# Patient Record
Sex: Female | Born: 1980 | Race: White | Hispanic: No | Marital: Married | Smoking: Never smoker
Health system: Southern US, Community
[De-identification: ages and names within clinical notes are randomized; demographics above are authoritative.]

## PROBLEM LIST (undated history)

## (undated) DIAGNOSIS — N912 Amenorrhea, unspecified: Secondary | ICD-10-CM

## (undated) DIAGNOSIS — K429 Umbilical hernia without obstruction or gangrene: Secondary | ICD-10-CM

---

## 2009-02-09 ENCOUNTER — Ambulatory Visit (HOSPITAL_COMMUNITY): Admission: RE | Admit: 2009-02-09 | Discharge: 2009-02-09 | Payer: Self-pay | Admitting: Gynecology

## 2009-11-05 ENCOUNTER — Inpatient Hospital Stay (HOSPITAL_COMMUNITY): Admission: AD | Admit: 2009-11-05 | Discharge: 2009-11-07 | Payer: Self-pay | Admitting: Obstetrics & Gynecology

## 2010-09-13 LAB — CBC
MCHC: 35.2 g/dL (ref 30.0–36.0)
MCV: 93.8 fL (ref 78.0–100.0)
Platelets: 151 10*3/uL (ref 150–400)
Platelets: 164 10*3/uL (ref 150–400)
RDW: 12.2 % (ref 11.5–15.5)
WBC: 14.7 10*3/uL — ABNORMAL HIGH (ref 4.0–10.5)

## 2010-09-13 LAB — RPR: RPR Ser Ql: NONREACTIVE

## 2011-01-17 ENCOUNTER — Other Ambulatory Visit (HOSPITAL_COMMUNITY): Payer: Self-pay | Admitting: Gynecology

## 2011-01-17 DIAGNOSIS — N979 Female infertility, unspecified: Secondary | ICD-10-CM

## 2011-01-24 ENCOUNTER — Ambulatory Visit (HOSPITAL_COMMUNITY)
Admission: RE | Admit: 2011-01-24 | Discharge: 2011-01-24 | Disposition: A | Discharge status: Home / Self Care | Payer: BC Managed Care – PPO | Source: Ambulatory Visit | Attending: Gynecology | Admitting: Gynecology

## 2011-01-24 DIAGNOSIS — N979 Female infertility, unspecified: Secondary | ICD-10-CM | POA: Insufficient documentation

## 2011-01-24 MED ORDER — IOHEXOL 300 MG/ML  SOLN: 10.0000 mL | Freq: Once | INTRAMUSCULAR | Status: AC | PRN

## 2011-06-27 NOTE — L&D Delivery Note (Signed)
Delivery Note At 6:26 AM a viable and healthy female was delivered via Vaginal, Spontaneous Delivery (Presentation: Right Occiput Anterior).  APGAR: 9, 9; weight Pending.   Placenta status: Intact, Spontaneous.  Cord: 3 vessels   Anesthesia: None, 15cc 1% lidocaine Episiotomy: None Lacerations: 2nd degree;Perineal Suture Repair: 3.0 vicryl Est. Blood Loss (mL): 250 cc  Mom to postpartum.  Baby to nursery-stable.  Marcell Chavarin H. 06/06/2012, 6:50 AM

## 2011-11-29 LAB — OB RESULTS CONSOLE GC/CHLAMYDIA: Chlamydia: NEGATIVE

## 2011-11-29 LAB — OB RESULTS CONSOLE RPR: RPR: NONREACTIVE

## 2011-11-29 LAB — OB RESULTS CONSOLE HIV ANTIBODY (ROUTINE TESTING): HIV: NONREACTIVE

## 2011-11-29 LAB — OB RESULTS CONSOLE GBS: GBS: POSITIVE

## 2012-05-04 IMAGING — RF DG HYSTEROGRAM
8 series · 8 of 8 positions shown · non-contrast
Comparison: 02/09/2009

HYSTEROSALPINGOGRAM

CLINICAL DATA: Infertility
TECHNIQUE: Hysterosalpingogram was performed by the ordering
physician under fluoroscopy.  Fluoroscopic images are submitted for
interpretation following the procedure.

Fluoroscopy Time:  1.6 minutes.

[Series 1: run · 1 of 1 slices shown (1 of 8)]
[im 1/1]
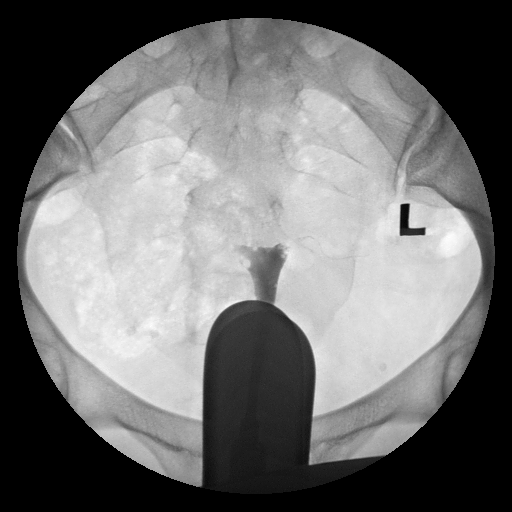

[Series 2: run · 1 of 1 slices shown (2 of 8)]
[im 1/1]
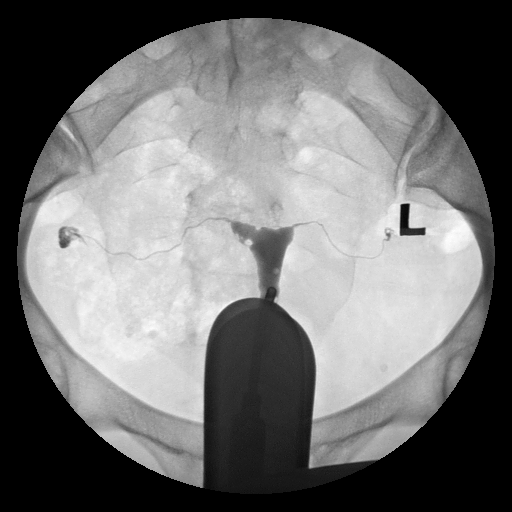

[Series 3: run · 1 of 1 slices shown (3 of 8)]
[im 1/1]
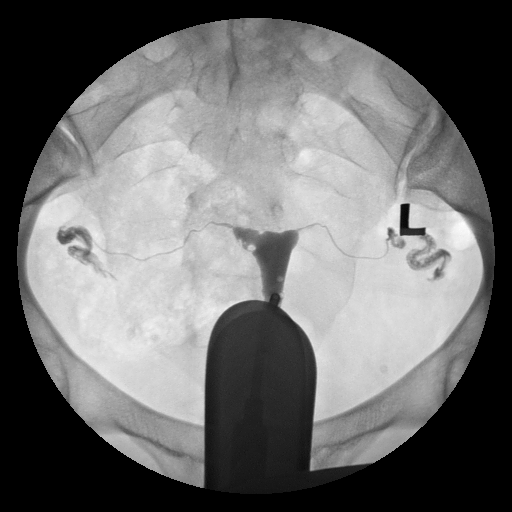

[Series 4: run · 1 of 1 slices shown (4 of 8)]
[im 1/1]
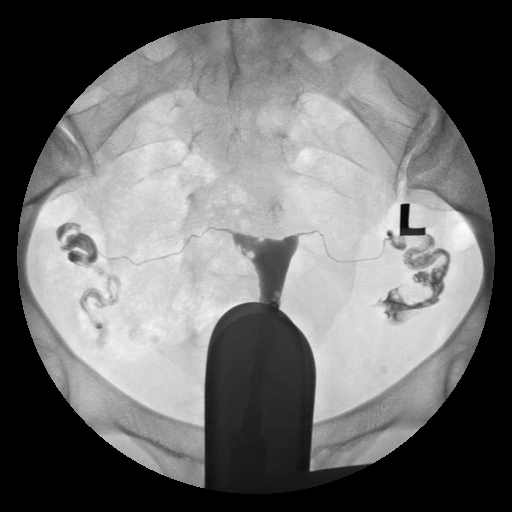

[Series 5: run · 1 of 1 slices shown (5 of 8)]
[im 1/1]
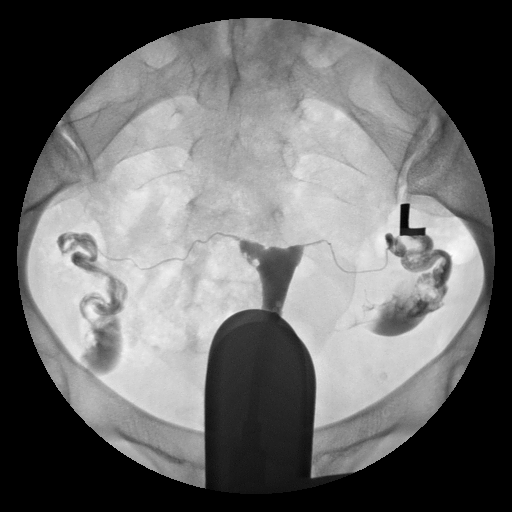

[Series 6: run · 1 of 1 slices shown (6 of 8)]
[im 1/1]
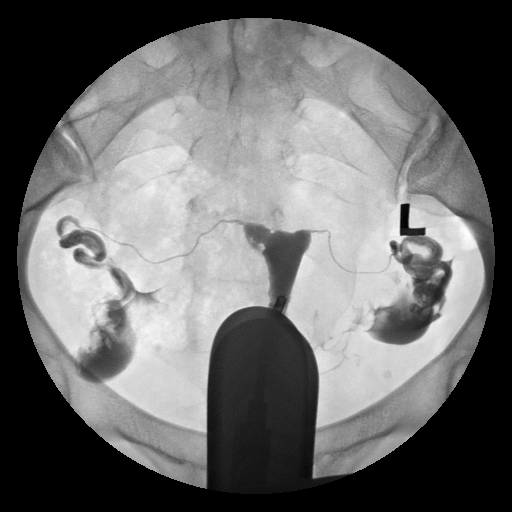

[Series 7: run · 1 of 1 slices shown (7 of 8)]
[im 1/1]
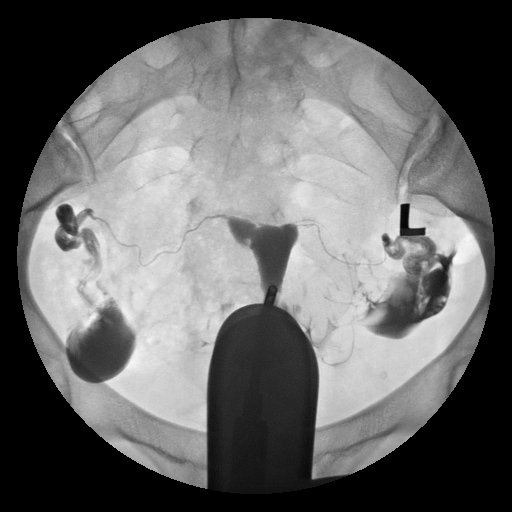

[Series 8: run · 1 of 1 slices shown (8 of 8)]
[im 1/1]
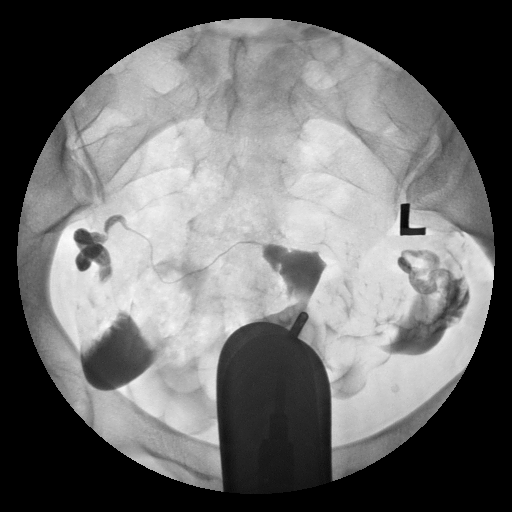

[8 of 8 positions shown; findings below may reference images not displayed]

FINDINGS: The endometrial cavity of the uterus is normal in
contour, although there are multiple rounded oval filling defects,
some which are mobile, others which are not.

Contrast filling of both fallopian tubes is seen, and both tubes
are normal in appearance.  Intraperitoneal spill of the contrast
from both fallopian tubes is demonstrated.  An image of the cervix
with cannula and speculum removed was not obtained.
IMPRESSION: Bilateral tubal patency.

Filling defects within the endometrial canal could represent
polyps, submucosal fibroids, or air bubbles.

## 2012-06-05 ENCOUNTER — Encounter (HOSPITAL_COMMUNITY): Payer: Self-pay | Admitting: *Deleted

## 2012-06-05 ENCOUNTER — Inpatient Hospital Stay (HOSPITAL_COMMUNITY)
Admission: AD | Admit: 2012-06-05 | Discharge: 2012-06-07 | DRG: 373 | Disposition: A | Discharge status: Home / Self Care | Payer: BC Managed Care – PPO | Source: Ambulatory Visit | Attending: Obstetrics and Gynecology | Admitting: Obstetrics and Gynecology

## 2012-06-05 DIAGNOSIS — O99892 Other specified diseases and conditions complicating childbirth: Secondary | ICD-10-CM | POA: Diagnosis present

## 2012-06-05 DIAGNOSIS — Z22330 Carrier of Group B streptococcus: Secondary | ICD-10-CM

## 2012-06-05 LAB — CBC
HCT: 33.3 % — ABNORMAL LOW (ref 36.0–46.0)
Hemoglobin: 11.3 g/dL — ABNORMAL LOW (ref 12.0–15.0)
RBC: 3.73 MIL/uL — ABNORMAL LOW (ref 3.87–5.11)
RDW: 12.4 % (ref 11.5–15.5)
WBC: 12 10*3/uL — ABNORMAL HIGH (ref 4.0–10.5)

## 2012-06-05 MED ORDER — OXYTOCIN BOLUS FROM INFUSION: 500.0000 mL | INTRAVENOUS | Status: DC

## 2012-06-05 MED ORDER — BUTORPHANOL TARTRATE 1 MG/ML IJ SOLN: 1.0000 mg | INTRAMUSCULAR | Status: DC | PRN

## 2012-06-05 MED ORDER — OXYTOCIN 40 UNITS IN LACTATED RINGERS INFUSION - SIMPLE MED
62.5000 mL/h | INTRAVENOUS | Status: DC
  Filled 2012-06-05: qty 1000

## 2012-06-05 MED ORDER — LACTATED RINGERS IV SOLN: 500.0000 mL | INTRAVENOUS | Status: DC | PRN

## 2012-06-05 MED ORDER — PENICILLIN G POTASSIUM 5000000 UNITS IJ SOLR
5.0000 10*6.[IU] | Freq: Once | INTRAVENOUS | Status: AC
  Administered 2012-06-06: 5 10*6.[IU] via INTRAVENOUS
  Filled 2012-06-05: qty 5

## 2012-06-05 MED ORDER — ONDANSETRON HCL 4 MG/2ML IJ SOLN: 4.0000 mg | Freq: Four times a day (QID) | INTRAMUSCULAR | Status: DC | PRN

## 2012-06-05 MED ORDER — LACTATED RINGERS IV SOLN: INTRAVENOUS | Status: DC

## 2012-06-05 MED ORDER — ACETAMINOPHEN 325 MG PO TABS: 650.0000 mg | ORAL_TABLET | ORAL | Status: DC | PRN

## 2012-06-05 MED ORDER — IBUPROFEN 600 MG PO TABS
600.0000 mg | ORAL_TABLET | Freq: Four times a day (QID) | ORAL | Status: DC | PRN
  Administered 2012-06-06: 600 mg via ORAL
  Filled 2012-06-05: qty 1

## 2012-06-05 MED ORDER — LIDOCAINE HCL (PF) 1 % IJ SOLN
30.0000 mL | INTRAMUSCULAR | Status: DC | PRN
  Administered 2012-06-06: 30 mL via SUBCUTANEOUS
  Filled 2012-06-05: qty 30

## 2012-06-05 MED ORDER — OXYCODONE-ACETAMINOPHEN 5-325 MG PO TABS
1.0000 | ORAL_TABLET | ORAL | Status: DC | PRN
Start: 2012-06-05 — End: 2012-06-06

## 2012-06-05 MED ORDER — PENICILLIN G POTASSIUM 5000000 UNITS IJ SOLR
2.5000 10*6.[IU] | INTRAVENOUS | Status: DC
  Administered 2012-06-06: 2.5 10*6.[IU] via INTRAVENOUS
  Filled 2012-06-05 (×4): qty 2.5

## 2012-06-05 MED ORDER — CITRIC ACID-SODIUM CITRATE 334-500 MG/5ML PO SOLN: 30.0000 mL | ORAL | Status: DC | PRN

## 2012-06-05 NOTE — MAU Note (Signed)
Pt G3 P1 at 38.4wks contracting every .  Seen in the office today SVE 4/90%.

## 2012-06-05 NOTE — MAU Note (Signed)
Pt states she has been having contractions since about 1800. Pt states she was examined by Dr Tenny Craw. 4cm

## 2012-06-05 NOTE — H&P (Signed)
Felicia Nichols is a 31 y.o. female presenting for contractions.  31 yo G2P0101 @ 38+4 presents c/o contractions. The patient was seen earlier today in the office and her cervix was 4/80/-2.  Since 6 pm she has noted more painful contractions and some bloody show.  She presents now with painful contractions every 5 minutes.  Her pregnancy has been uncomplicated up to this point except for GBS + urine culture in the beginning of the pregnancy. Her cervix is now 4-5/c/0 station and she'll be admitted for labor. History OB History    Grav Para Term Preterm Abortions TAB SAB Ect Mult Living   2 1 0 1 0 0 0 0 0 1      Past Medical History  Diagnosis Date  . No pertinent past medical history    Past Surgical History  Procedure Date  . No past surgeries    Family History: family history is negative for Alcohol abuse, and Arthritis, and Asthma, and Birth defects, and Cancer, and COPD, and Depression, and Diabetes, and Drug abuse, and Early death, and Hearing loss, and Heart disease, and Hyperlipidemia, and Hypertension, and Kidney disease, and Learning disabilities, and Mental illness, and Mental retardation, and Miscarriages / Stillbirths, and Stroke, and Vision loss, . Social History:  reports that she has never smoked. She does not have any smokeless tobacco history on file. She reports that she does not drink alcohol or use illicit drugs.   Prenatal Transfer Tool  Maternal Diabetes: No. Elevated 1hr gtt, 3hr WNL Genetic Screening: Normal Maternal Ultrasounds/Referrals: Normal Fetal Ultrasounds or other Referrals:  None Maternal Substance Abuse:  No Significant Maternal Medications:  None Significant Maternal Lab Results:  None Other Comments:  None  ROS: as above    Blood pressure 120/81, pulse 69, temperature 97.9 F (36.6 C), temperature source Oral, resp. rate 18, height 5\' 8"  (1.727 m), weight 80.105 kg (176 lb 9.6 oz), last menstrual period 01/15/2011. Exam Physical Exam  AOx3,  NAD Gravid, soft, NT Cvx 4-5/c/0 toco q3-5 FHT 140 reactive  Prenatal labs: ABO, Rh:  A pos Antibody:  Neg Rubella:  Imm RPR:   NR HBsAg:   Neg HIV:   NR GBS:   Positive  Assessment/Plan: 1) Admit 2) PCN for GBS 3) Expectant management 4) Epidural on request   Murrell Dome H. 06/05/2012, 10:08 PM

## 2012-06-06 ENCOUNTER — Encounter (HOSPITAL_COMMUNITY): Payer: Self-pay | Admitting: *Deleted

## 2012-06-06 LAB — ABO/RH: ABO/RH(D): A POS

## 2012-06-06 LAB — TYPE AND SCREEN

## 2012-06-06 MED ORDER — PRENATAL MULTIVITAMIN CH
1.0000 | ORAL_TABLET | Freq: Every day | ORAL | Status: DC
  Administered 2012-06-06 – 2012-06-07 (×2): 1 via ORAL
  Filled 2012-06-06 (×2): qty 1

## 2012-06-06 MED ORDER — DIPHENHYDRAMINE HCL 50 MG/ML IJ SOLN: 12.5000 mg | INTRAMUSCULAR | Status: DC | PRN

## 2012-06-06 MED ORDER — ONDANSETRON HCL 4 MG/2ML IJ SOLN: 4.0000 mg | INTRAMUSCULAR | Status: DC | PRN

## 2012-06-06 MED ORDER — TERBUTALINE SULFATE 1 MG/ML IJ SOLN: 0.2500 mg | Freq: Once | INTRAMUSCULAR | Status: DC | PRN

## 2012-06-06 MED ORDER — OXYCODONE-ACETAMINOPHEN 5-325 MG PO TABS
1.0000 | ORAL_TABLET | ORAL | Status: DC | PRN
  Administered 2012-06-06 (×2): 1 via ORAL
  Filled 2012-06-06 (×3): qty 1

## 2012-06-06 MED ORDER — IBUPROFEN 600 MG PO TABS
600.0000 mg | ORAL_TABLET | Freq: Four times a day (QID) | ORAL | Status: DC
  Administered 2012-06-06 – 2012-06-07 (×5): 600 mg via ORAL
  Filled 2012-06-06 (×5): qty 1

## 2012-06-06 MED ORDER — FENTANYL 2.5 MCG/ML BUPIVACAINE 1/10 % EPIDURAL INFUSION (WH - ANES): 14.0000 mL/h | INTRAMUSCULAR | Status: DC

## 2012-06-06 MED ORDER — DIBUCAINE 1 % RE OINT
1.0000 | TOPICAL_OINTMENT | RECTAL | Status: DC | PRN
Start: 2012-06-06 — End: 2012-06-07

## 2012-06-06 MED ORDER — OXYTOCIN 40 UNITS IN LACTATED RINGERS INFUSION - SIMPLE MED: 1.0000 m[IU]/min | INTRAVENOUS | Status: DC

## 2012-06-06 MED ORDER — LACTATED RINGERS IV SOLN: 500.0000 mL | Freq: Once | INTRAVENOUS | Status: DC

## 2012-06-06 MED ORDER — METHYLERGONOVINE MALEATE 0.2 MG/ML IJ SOLN: 0.2000 mg | INTRAMUSCULAR | Status: DC | PRN

## 2012-06-06 MED ORDER — ONDANSETRON HCL 4 MG PO TABS: 4.0000 mg | ORAL_TABLET | ORAL | Status: DC | PRN

## 2012-06-06 MED ORDER — EPHEDRINE 5 MG/ML INJ: 10.0000 mg | INTRAVENOUS | Status: DC | PRN

## 2012-06-06 MED ORDER — SENNOSIDES-DOCUSATE SODIUM 8.6-50 MG PO TABS
2.0000 | ORAL_TABLET | Freq: Every day | ORAL | Status: DC
  Administered 2012-06-06: 2 via ORAL

## 2012-06-06 MED ORDER — TETANUS-DIPHTH-ACELL PERTUSSIS 5-2.5-18.5 LF-MCG/0.5 IM SUSP
0.5000 mL | Freq: Once | INTRAMUSCULAR | Status: AC
  Administered 2012-06-07: 0.5 mL via INTRAMUSCULAR

## 2012-06-06 MED ORDER — DIPHENHYDRAMINE HCL 25 MG PO CAPS: 25.0000 mg | ORAL_CAPSULE | Freq: Four times a day (QID) | ORAL | Status: DC | PRN

## 2012-06-06 MED ORDER — SIMETHICONE 80 MG PO CHEW: 80.0000 mg | CHEWABLE_TABLET | ORAL | Status: DC | PRN

## 2012-06-06 MED ORDER — BENZOCAINE-MENTHOL 20-0.5 % EX AERO
1.0000 "application " | INHALATION_SPRAY | CUTANEOUS | Status: DC | PRN
  Filled 2012-06-06: qty 56

## 2012-06-06 MED ORDER — METHYLERGONOVINE MALEATE 0.2 MG PO TABS: 0.2000 mg | ORAL_TABLET | ORAL | Status: DC | PRN

## 2012-06-06 MED ORDER — WITCH HAZEL-GLYCERIN EX PADS
1.0000 | MEDICATED_PAD | CUTANEOUS | Status: DC | PRN
Start: 2012-06-06 — End: 2012-06-07

## 2012-06-06 MED ORDER — ZOLPIDEM TARTRATE 5 MG PO TABS: 5.0000 mg | ORAL_TABLET | Freq: Every evening | ORAL | Status: DC | PRN

## 2012-06-06 MED ORDER — PHENYLEPHRINE 40 MCG/ML (10ML) SYRINGE FOR IV PUSH (FOR BLOOD PRESSURE SUPPORT): 80.0000 ug | PREFILLED_SYRINGE | INTRAVENOUS | Status: DC | PRN

## 2012-06-06 MED ORDER — LANOLIN HYDROUS EX OINT: TOPICAL_OINTMENT | CUTANEOUS | Status: DC | PRN

## 2012-06-06 NOTE — Progress Notes (Signed)
  S: uncomfortable with contractions but not requiring pain medication O: Filed Vitals:   06/06/12 0132 06/06/12 0224 06/06/12 0357 06/06/12 0430  BP:  104/66 116/75   Pulse:  68 66   Temp:   98.4 F (36.9 C)   TempSrc:   Axillary   Resp: 18 18 18 18   Height:      Weight:       FHT 140-150 reactive with accelerations no decelerations cvx 5-6/c/0 toco Q 5  AROM clear  A/P 1) Cont PCN 2) FWB reassuring

## 2012-06-07 LAB — CBC
MCH: 31.2 pg (ref 26.0–34.0)
MCHC: 34.4 g/dL (ref 30.0–36.0)
MCV: 90.7 fL (ref 78.0–100.0)
Platelets: 127 10*3/uL — ABNORMAL LOW (ref 150–400)
RBC: 3.33 MIL/uL — ABNORMAL LOW (ref 3.87–5.11)

## 2012-06-07 NOTE — Discharge Summary (Signed)
Obstetric Discharge Summary Reason for Admission: onset of labor Prenatal Procedures: ultrasound Intrapartum Procedures: spontaneous vaginal delivery Postpartum Procedures: none Complications-Operative and Postpartum: 2nd degree perineal laceration Hemoglobin  Date Value Range Status  06/07/2012 10.4* 12.0 - 15.0 g/dL Final     HCT  Date Value Range Status  06/07/2012 30.2* 36.0 - 46.0 % Final    Physical Exam:  General: alert Lochia: appropriate Uterine Fundus: firm  Discharge Diagnoses: Term Pregnancy-delivered  Discharge Information: Date: 06/07/2012 Activity: pelvic rest Diet: routine Medications: PNV and Ibuprofen Condition: improved Instructions: refer to practice specific booklet Discharge to: home Follow-up Information    Schedule an appointment as soon as possible for a visit with Almon Hercules., MD.   Contact information:   [address] [place] [address] [phone]          Newborn Data: Live born female  Birth Weight: 7 lb 7 oz (3374 g) APGAR: 9, 9  Home with mother.  Mamoudou Mulvehill D 06/07/2012, 1:55 PM

## 2013-03-05 ENCOUNTER — Encounter (INDEPENDENT_AMBULATORY_CARE_PROVIDER_SITE_OTHER): Payer: Self-pay | Admitting: Surgery

## 2013-03-11 ENCOUNTER — Ambulatory Visit (INDEPENDENT_AMBULATORY_CARE_PROVIDER_SITE_OTHER): Payer: Self-pay | Admitting: Surgery

## 2013-04-15 ENCOUNTER — Ambulatory Visit (INDEPENDENT_AMBULATORY_CARE_PROVIDER_SITE_OTHER): Payer: Self-pay | Admitting: Surgery

## 2013-06-24 ENCOUNTER — Encounter (INDEPENDENT_AMBULATORY_CARE_PROVIDER_SITE_OTHER): Payer: Self-pay | Admitting: Surgery

## 2013-06-27 ENCOUNTER — Encounter (INDEPENDENT_AMBULATORY_CARE_PROVIDER_SITE_OTHER): Payer: Self-pay

## 2013-06-27 ENCOUNTER — Ambulatory Visit (INDEPENDENT_AMBULATORY_CARE_PROVIDER_SITE_OTHER): Payer: BC Managed Care – PPO | Admitting: Surgery

## 2013-06-27 ENCOUNTER — Encounter (INDEPENDENT_AMBULATORY_CARE_PROVIDER_SITE_OTHER): Payer: Self-pay | Admitting: Surgery

## 2013-06-27 VITALS — BP 110/64 | HR 64 | Temp 98.1°F | Resp 14 | Ht 67.0 in | Wt 114.6 lb

## 2013-06-27 DIAGNOSIS — K429 Umbilical hernia without obstruction or gangrene: Secondary | ICD-10-CM

## 2013-06-27 NOTE — Progress Notes (Addendum)
Patient ID: Felicia Nichols, female   DOB: 12/13/1980, 33 y.o.   MRN: [id number]  Chief Complaint  Patient presents with  . New Evaluation    eval umb hernia    HPI Felicia Nichols is a 33 y.o. female.   HPI This is a very pleasant female referred [facility]  for evaluation of a small umbilical hernia. She has had it since pregnancy. It is now getting an occasionally larger and causing her have some mild discomfort with lifting her children. She denies any attractive symptoms. She is otherwise doing well and has no complaints. The pain is described as a mild ache and does not referred anywhere else Past Medical History  Diagnosis Date  . No pertinent past medical history     Past Surgical History  Procedure Laterality Date  . No past surgeries    . Varicose vein surgery      Family History  Problem Relation Age of Onset  . Alcohol abuse Neg Hx   . Arthritis Neg Hx   . Asthma Neg Hx   . Birth defects Neg Hx   . COPD Neg Hx   . Depression Neg Hx   . Diabetes Neg Hx   . Drug abuse Neg Hx   . Early death Neg Hx   . Hearing loss Neg Hx   . Heart disease Neg Hx   . Hyperlipidemia Neg Hx   . Hypertension Neg Hx   . Kidney disease Neg Hx   . Learning disabilities Neg Hx   . Mental illness Neg Hx   . Mental retardation Neg Hx   . Miscarriages / Stillbirths Neg Hx   . Stroke Neg Hx   . Vision loss Neg Hx   . Cancer Paternal Aunt     ovarian    Social History History  Substance Use Topics  . Smoking status: Never Smoker   . Smokeless tobacco: Never Used  . Alcohol Use: Yes     Comment: rarely    No Known Allergies  Current Outpatient Prescriptions  Medication Sig Dispense Refill  . medroxyPROGESTERone (PROVERA) 5 MG tablet Take 10 mg by mouth daily.      . Multiple Vitamin (MULTIVITAMIN) tablet Take 1 tablet by mouth daily.       No current facility-administered medications for this visit.    Review of Systems Review of Systems  Constitutional:  Negative for fever, chills and unexpected weight change.  HENT: Negative for congestion, hearing loss, sore throat, trouble swallowing and voice change.   Eyes: Negative for visual disturbance.  Respiratory: Negative for cough and wheezing.   Cardiovascular: Negative for chest pain, palpitations and leg swelling.  Gastrointestinal: Positive for abdominal pain. Negative for nausea, vomiting, diarrhea, constipation, blood in stool, abdominal distention and anal bleeding.  Genitourinary: Negative for hematuria, vaginal bleeding and difficulty urinating.  Musculoskeletal: Negative for arthralgias.  Skin: Negative for rash and wound.  Neurological: Negative for seizures, syncope and headaches.  Hematological: Negative for adenopathy. Does not bruise/bleed easily.  Psychiatric/Behavioral: Negative for confusion.    Blood pressure 110/64, pulse 64, temperature 98.1 F (36.7 C), temperature source Temporal, resp. rate 14, height 5\' 7"  (1.702 m), weight 114 lb 9.6 oz (51.982 kg).  Physical Exam Physical Exam  Constitutional: She appears well-developed and well-nourished. No distress.  HENT:  Head: Normocephalic and atraumatic.  Right Ear: External ear normal.  Left Ear: External ear normal.  Nose: Nose normal.  Mouth/Throat: Oropharynx is clear  and moist. No oropharyngeal exudate.  Eyes: Conjunctivae are normal. Pupils are equal, round, and reactive to light. Right eye exhibits no discharge. Left eye exhibits no discharge. No scleral icterus.  Neck: Normal range of motion. Neck supple. No tracheal deviation present.  Cardiovascular: Normal rate, regular rhythm, normal heart sounds and intact distal pulses.   No murmur heard. Pulmonary/Chest: Effort normal and breath sounds normal. No respiratory distress. She has no wheezes.  Abdominal: Soft. Bowel sounds are normal. There is no tenderness. There is no guarding.  Very small, easily reducible umbilical hernia  Musculoskeletal: Normal range of  motion. She exhibits no edema and no tenderness.  Lymphadenopathy:    She has no cervical adenopathy.  Neurological: She is alert.  Skin: Skin is warm and dry. No rash noted. She is not diaphoretic. No erythema.  Psychiatric: Her behavior is normal. Judgment normal.    Data Reviewed   Assessment    Umbilical hernia     Plan    Repair with possible mesh is recommended. I explained the diagnosis to her in detail as well as the need for surgery. She was to go ahead and get the hernia fixed. I described use of mesh. I described the risks of surgery which includes but is not limited to bleeding, infection, recurrence, et Karie Soda. She understands and wishes to proceed. Surgery will be scheduled. I also discussed postoperative recovery.        Wayden Schwertner A 06/27/2013, 9:15 AM

## 2013-07-11 ENCOUNTER — Encounter (INDEPENDENT_AMBULATORY_CARE_PROVIDER_SITE_OTHER): Payer: Self-pay

## 2013-07-29 ENCOUNTER — Telehealth (INDEPENDENT_AMBULATORY_CARE_PROVIDER_SITE_OTHER): Payer: Self-pay | Admitting: Surgery

## 2013-07-29 NOTE — Telephone Encounter (Signed)
07/29/13 patient left a voicemail stating she is NOT going to have surgery at this time. She wants to wait until her kids are older. skm

## 2014-04-27 ENCOUNTER — Encounter (INDEPENDENT_AMBULATORY_CARE_PROVIDER_SITE_OTHER): Payer: Self-pay | Admitting: Surgery

## 2014-05-26 HISTORY — PX: BREAST ENHANCEMENT SURGERY: SHX7

## 2016-04-03 ENCOUNTER — Other Ambulatory Visit: Payer: Self-pay | Admitting: Surgery

## 2016-07-05 ENCOUNTER — Other Ambulatory Visit: Payer: Self-pay | Admitting: Surgery

## 2016-07-27 ENCOUNTER — Encounter (HOSPITAL_BASED_OUTPATIENT_CLINIC_OR_DEPARTMENT_OTHER): Payer: Self-pay | Admitting: *Deleted

## 2016-07-27 DIAGNOSIS — K429 Umbilical hernia without obstruction or gangrene: Secondary | ICD-10-CM

## 2016-07-27 HISTORY — DX: Umbilical hernia without obstruction or gangrene: K42.9

## 2016-09-13 ENCOUNTER — Encounter (HOSPITAL_BASED_OUTPATIENT_CLINIC_OR_DEPARTMENT_OTHER): Payer: Self-pay | Admitting: *Deleted

## 2016-09-13 NOTE — H&P (Signed)
  Felicia Nichols  [facility] Patient #: [id number] DOB: 02/28/1981 Married / Language: English / Race: White Female   History of Present Illness Patient words: Abdominal hernia.  The patient is a 36 year old female who presents with an umbilical hernia. This is a pleasant female who I saw back in 2015 with a small umbilical hernia which had developed during pregnancy. It had not resolved so we decided we would do an umbilical hernia repair. Because it was minimally symptomatic and her children were small, she has decided to hold on surgery until now. She reports now the hernia slightly larger and now causing discomfort but no obstructive symptoms. She has had no change in her medical care and is otherwise healthy without complaints  PMHX: neg PSHx: Varicose vein surgery  Allergies  No Known Drug Allergies   Medication History  Multivitamin Adult (Oral) Active. Medications Reconciled  SHx: neg Vitals   Weight: 117.2 lb Height: 67in Body Surface Area: 1.61 m Body Mass Index: 18.36 kg/m  Pulse: 42 (Regular)  BP: 104/58 (Sitting, Left Arm, Standard)     Physical Exam  The physical exam findings are as follows: Note:On exam, her abdomen is soft. There is a small, easily reducible umbilical hernia which is nontender Generally well in appearance Lungs clear CV RRR Ext without edema Skin without erythema    Assessment & Plan  UMBILICAL HERNIA (K42.9)  Impression: By physical examination, the hernia has gotten larger. I would recommend repair and she does want to proceed with this. I discussed these mesh with repair. I discussed the risks again. These include but are not limited to bleeding, infection, use of mesh, recurrence, injury to surrounding structures, DVT, etc. She understands and surgery will be scheduled.

## 2016-09-14 ENCOUNTER — Ambulatory Visit (HOSPITAL_BASED_OUTPATIENT_CLINIC_OR_DEPARTMENT_OTHER): Payer: BC Managed Care – PPO | Admitting: Certified Registered"

## 2016-09-14 ENCOUNTER — Encounter (HOSPITAL_BASED_OUTPATIENT_CLINIC_OR_DEPARTMENT_OTHER)
Admission: RE | Disposition: A | Discharge status: Home / Self Care | Payer: Self-pay | Source: Ambulatory Visit | Attending: Surgery

## 2016-09-14 ENCOUNTER — Ambulatory Visit (HOSPITAL_BASED_OUTPATIENT_CLINIC_OR_DEPARTMENT_OTHER)
Admission: RE | Admit: 2016-09-14 | Discharge: 2016-09-14 | Disposition: A | Discharge status: Home / Self Care | Payer: BC Managed Care – PPO | Source: Ambulatory Visit | Attending: Surgery | Admitting: Surgery

## 2016-09-14 ENCOUNTER — Encounter (HOSPITAL_BASED_OUTPATIENT_CLINIC_OR_DEPARTMENT_OTHER): Payer: Self-pay | Admitting: Certified Registered"

## 2016-09-14 DIAGNOSIS — K429 Umbilical hernia without obstruction or gangrene: Secondary | ICD-10-CM | POA: Diagnosis present

## 2016-09-14 HISTORY — PX: UMBILICAL HERNIA REPAIR: SHX196

## 2016-09-14 HISTORY — DX: Umbilical hernia without obstruction or gangrene: K42.9

## 2016-09-14 HISTORY — DX: Amenorrhea, unspecified: N91.2

## 2016-09-14 SURGERY — REPAIR, HERNIA, UMBILICAL, ADULT
Anesthesia: General | Site: Abdomen

## 2016-09-14 MED ORDER — BUPIVACAINE HCL (PF) 0.5 % IJ SOLN
INTRAMUSCULAR | Status: DC | PRN
  Administered 2016-09-14: 17 mL

## 2016-09-14 MED ORDER — ACETAMINOPHEN 325 MG PO TABS: 650.0000 mg | ORAL_TABLET | ORAL | Status: DC | PRN

## 2016-09-14 MED ORDER — MIDAZOLAM HCL 2 MG/2ML IJ SOLN
1.0000 mg | INTRAMUSCULAR | Status: DC | PRN
  Administered 2016-09-14: 2 mg via INTRAVENOUS

## 2016-09-14 MED ORDER — DEXAMETHASONE SODIUM PHOSPHATE 4 MG/ML IJ SOLN
INTRAMUSCULAR | Status: DC | PRN
  Administered 2016-09-14: 10 mg via INTRAVENOUS

## 2016-09-14 MED ORDER — CHLORHEXIDINE GLUCONATE CLOTH 2 % EX PADS: 6.0000 | MEDICATED_PAD | Freq: Once | CUTANEOUS | Status: DC

## 2016-09-14 MED ORDER — LACTATED RINGERS IV SOLN
INTRAVENOUS | Status: DC
  Administered 2016-09-14 (×2): via INTRAVENOUS

## 2016-09-14 MED ORDER — OXYCODONE HCL 5 MG PO TABS: 5.0000 mg | ORAL_TABLET | Freq: Once | ORAL | Status: DC | PRN

## 2016-09-14 MED ORDER — MIDAZOLAM HCL 2 MG/2ML IJ SOLN
INTRAMUSCULAR | Status: AC
Start: 2016-09-14 — End: 2016-09-14
  Filled 2016-09-14: qty 2

## 2016-09-14 MED ORDER — PROPOFOL 10 MG/ML IV BOLUS
INTRAVENOUS | Status: DC | PRN
  Administered 2016-09-14: 150 mg via INTRAVENOUS

## 2016-09-14 MED ORDER — FENTANYL CITRATE (PF) 100 MCG/2ML IJ SOLN: 50.0000 ug | INTRAMUSCULAR | Status: DC | PRN

## 2016-09-14 MED ORDER — SCOPOLAMINE 1 MG/3DAYS TD PT72: 1.0000 | MEDICATED_PATCH | Freq: Once | TRANSDERMAL | Status: DC | PRN

## 2016-09-14 MED ORDER — MORPHINE SULFATE (PF) 2 MG/ML IV SOLN
1.0000 mg | INTRAVENOUS | Status: DC | PRN
Start: 2016-09-14 — End: 2016-09-14

## 2016-09-14 MED ORDER — ONDANSETRON HCL 4 MG/2ML IJ SOLN
INTRAMUSCULAR | Status: AC
  Filled 2016-09-14: qty 2

## 2016-09-14 MED ORDER — SODIUM CHLORIDE 0.9% FLUSH: 3.0000 mL | INTRAVENOUS | Status: DC | PRN

## 2016-09-14 MED ORDER — BUPIVACAINE HCL (PF) 0.5 % IJ SOLN
INTRAMUSCULAR | Status: AC
  Filled 2016-09-14: qty 30

## 2016-09-14 MED ORDER — FENTANYL CITRATE (PF) 100 MCG/2ML IJ SOLN
50.0000 ug | INTRAMUSCULAR | Status: DC | PRN
  Administered 2016-09-14: 50 ug via INTRAVENOUS

## 2016-09-14 MED ORDER — DEXAMETHASONE SODIUM PHOSPHATE 10 MG/ML IJ SOLN
INTRAMUSCULAR | Status: AC
  Filled 2016-09-14: qty 1

## 2016-09-14 MED ORDER — CEFAZOLIN SODIUM-DEXTROSE 2-4 GM/100ML-% IV SOLN
2.0000 g | INTRAVENOUS | Status: AC
  Administered 2016-09-14: 2 g via INTRAVENOUS

## 2016-09-14 MED ORDER — EPHEDRINE SULFATE 50 MG/ML IJ SOLN: INTRAMUSCULAR | Status: DC | PRN

## 2016-09-14 MED ORDER — LACTATED RINGERS IV SOLN: INTRAVENOUS | Status: DC

## 2016-09-14 MED ORDER — ONDANSETRON HCL 4 MG/2ML IJ SOLN
INTRAMUSCULAR | Status: DC | PRN
  Administered 2016-09-14: 4 mg via INTRAVENOUS

## 2016-09-14 MED ORDER — ACETAMINOPHEN 650 MG RE SUPP: 650.0000 mg | RECTAL | Status: DC | PRN

## 2016-09-14 MED ORDER — OXYCODONE HCL 5 MG/5ML PO SOLN: 5.0000 mg | Freq: Once | ORAL | Status: DC | PRN

## 2016-09-14 MED ORDER — FENTANYL CITRATE (PF) 100 MCG/2ML IJ SOLN
INTRAMUSCULAR | Status: AC
  Filled 2016-09-14: qty 2

## 2016-09-14 MED ORDER — SODIUM CHLORIDE 0.9% FLUSH: 3.0000 mL | Freq: Two times a day (BID) | INTRAVENOUS | Status: DC

## 2016-09-14 MED ORDER — MEPERIDINE HCL 25 MG/ML IJ SOLN: 6.2500 mg | INTRAMUSCULAR | Status: DC | PRN

## 2016-09-14 MED ORDER — LIDOCAINE HCL (PF) 1 % IJ SOLN
INTRAMUSCULAR | Status: AC
  Filled 2016-09-14: qty 30

## 2016-09-14 MED ORDER — LIDOCAINE HCL (CARDIAC) 20 MG/ML IV SOLN
INTRAVENOUS | Status: DC | PRN
  Administered 2016-09-14: 60 mg via INTRAVENOUS

## 2016-09-14 MED ORDER — KETOROLAC TROMETHAMINE 30 MG/ML IJ SOLN
INTRAMUSCULAR | Status: DC | PRN
  Administered 2016-09-14: 30 mg via INTRAVENOUS

## 2016-09-14 MED ORDER — PROMETHAZINE HCL 25 MG/ML IJ SOLN: 6.2500 mg | INTRAMUSCULAR | Status: DC | PRN

## 2016-09-14 MED ORDER — LIDOCAINE 2% (20 MG/ML) 5 ML SYRINGE
INTRAMUSCULAR | Status: AC
  Filled 2016-09-14: qty 5

## 2016-09-14 MED ORDER — CEFAZOLIN SODIUM-DEXTROSE 2-4 GM/100ML-% IV SOLN
INTRAVENOUS | Status: AC
  Filled 2016-09-14: qty 100

## 2016-09-14 MED ORDER — HYDROMORPHONE HCL 1 MG/ML IJ SOLN: 0.2500 mg | INTRAMUSCULAR | Status: DC | PRN

## 2016-09-14 MED ORDER — MIDAZOLAM HCL 2 MG/2ML IJ SOLN: 1.0000 mg | INTRAMUSCULAR | Status: DC | PRN

## 2016-09-14 MED ORDER — HYDROCODONE-ACETAMINOPHEN 5-325 MG PO TABS: 1.0000 | ORAL_TABLET | ORAL | 0 refills | Status: AC | PRN

## 2016-09-14 MED ORDER — OXYCODONE HCL 5 MG PO TABS: 5.0000 mg | ORAL_TABLET | ORAL | Status: DC | PRN

## 2016-09-14 MED ORDER — SODIUM CHLORIDE 0.9 % IV SOLN: 250.0000 mL | INTRAVENOUS | Status: DC | PRN

## 2016-09-14 SURGICAL SUPPLY — 44 items
BLADE CLIPPER SURG (BLADE) IMPLANT
BLADE HEX COATED 2.75 (ELECTRODE) ×3 IMPLANT
BLADE SURG 15 STRL LF DISP TIS (BLADE) ×1 IMPLANT
BLADE SURG 15 STRL SS (BLADE) ×2
CANISTER SUCT 1200ML W/VALVE (MISCELLANEOUS) IMPLANT
CHLORAPREP W/TINT 26ML (MISCELLANEOUS) ×3 IMPLANT
COVER BACK TABLE 60X90IN (DRAPES) ×3 IMPLANT
COVER MAYO STAND STRL (DRAPES) ×3 IMPLANT
DECANTER SPIKE VIAL GLASS SM (MISCELLANEOUS) ×3 IMPLANT
DERMABOND ADVANCED (GAUZE/BANDAGES/DRESSINGS) ×4
DERMABOND ADVANCED .7 DNX12 (GAUZE/BANDAGES/DRESSINGS) ×2 IMPLANT
DRAPE LAPAROTOMY 100X72 PEDS (DRAPES) ×3 IMPLANT
DRAPE UTILITY XL STRL (DRAPES) ×3 IMPLANT
DRSG TEGADERM 2-3/8X2-3/4 SM (GAUZE/BANDAGES/DRESSINGS) IMPLANT
ELECT REM PT RETURN 9FT ADLT (ELECTROSURGICAL) ×3
ELECTRODE REM PT RTRN 9FT ADLT (ELECTROSURGICAL) ×1 IMPLANT
GLOVE BIOGEL M STRL SZ7.5 (GLOVE) ×3 IMPLANT
GLOVE BIOGEL PI IND STRL 8 (GLOVE) ×1 IMPLANT
GLOVE BIOGEL PI INDICATOR 8 (GLOVE) ×2
GLOVE EXAM NITRILE LRG STRL (GLOVE) ×3 IMPLANT
GLOVE SURG SIGNA 7.5 PF LTX (GLOVE) ×3 IMPLANT
GOWN STRL REUS W/ TWL LRG LVL3 (GOWN DISPOSABLE) IMPLANT
GOWN STRL REUS W/ TWL XL LVL3 (GOWN DISPOSABLE) ×2 IMPLANT
GOWN STRL REUS W/TWL LRG LVL3 (GOWN DISPOSABLE)
GOWN STRL REUS W/TWL XL LVL3 (GOWN DISPOSABLE) ×4
NEEDLE HYPO 25X1 1.5 SAFETY (NEEDLE) ×3 IMPLANT
NS IRRIG 1000ML POUR BTL (IV SOLUTION) IMPLANT
PACK BASIN DAY SURGERY FS (CUSTOM PROCEDURE TRAY) ×3 IMPLANT
PENCIL BUTTON HOLSTER BLD 10FT (ELECTRODE) ×3 IMPLANT
SLEEVE SCD COMPRESS KNEE MED (MISCELLANEOUS) ×3 IMPLANT
SPONGE LAP 4X18 X RAY DECT (DISPOSABLE) IMPLANT
SUT MNCRL AB 4-0 PS2 18 (SUTURE) ×3 IMPLANT
SUT NOVA 0 T19/GS 22DT (SUTURE) ×3 IMPLANT
SUT NOVA NAB DX-16 0-1 5-0 T12 (SUTURE) IMPLANT
SUT VIC AB 2-0 SH 27 (SUTURE)
SUT VIC AB 2-0 SH 27XBRD (SUTURE) IMPLANT
SUT VIC AB 3-0 SH 27 (SUTURE) ×2
SUT VIC AB 3-0 SH 27X BRD (SUTURE) ×1 IMPLANT
SYR CONTROL 10ML LL (SYRINGE) ×3 IMPLANT
TOWEL OR 17X24 6PK STRL BLUE (TOWEL DISPOSABLE) ×3 IMPLANT
TOWEL OR NON WOVEN STRL DISP B (DISPOSABLE) ×3 IMPLANT
TUBE CONNECTING 20'X1/4 (TUBING)
TUBE CONNECTING 20X1/4 (TUBING) IMPLANT
YANKAUER SUCT BULB TIP NO VENT (SUCTIONS) IMPLANT

## 2016-09-14 NOTE — Discharge Instructions (Signed)
[facility]  UMBILICAL OR INGUINAL HERNIA REPAIR: POST OP INSTRUCTIONS  Always review your discharge instruction sheet given to you by the facility where your surgery was performed. IF YOU HAVE DISABILITY OR FAMILY LEAVE FORMS, YOU MUST BRING THEM TO THE OFFICE FOR PROCESSING.   DO NOT GIVE THEM TO YOUR DOCTOR.  1. A  prescription for pain medication may be given to you upon discharge.  Take your pain medication as prescribed, if needed.  If narcotic pain medicine is not needed, then you may take acetaminophen (Tylenol) or ibuprofen (Advil) as needed. 2. Take your usually prescribed medications unless otherwise directed. If you need a refill on your pain medication, please contact your pharmacy.  They will contact our office to request authorization. Prescriptions will not be filled after 5 pm or on week-ends. 3. You should follow a light diet the first 24 hours after arrival home, such as soup and crackers, etc.  Be sure to include lots of fluids daily.  Resume your normal diet the day after surgery. 4.Most patients will experience some swelling and bruising around the umbilicus or in the groin and scrotum.  Ice packs and reclining will help.  Swelling and bruising can take several days to resolve.  6. It is common to experience some constipation if taking pain medication after surgery.  Increasing fluid intake and taking a stool softener (such as Colace) will usually help or prevent this problem from occurring.  A mild laxative (Milk of Magnesia or Miralax) should be taken according to package directions if there are no bowel movements after 48 hours. 7. Unless discharge instructions indicate otherwise, you may remove your bandages 24-48 hours after surgery, and you may shower at that time.  You may have steri-strips (small skin tapes) in place directly over the incision.  These strips should be left on the skin for 7-10 days.  If your surgeon used skin glue on the  incision, you may shower in 24 hours.  The glue will flake off over the next 2-3 weeks.  Any sutures or staples will be removed at the office during your follow-up visit. 8. ACTIVITIES:  You may resume regular (light) daily activities beginning the next day--such as daily self-care, walking, climbing stairs--gradually increasing activities as tolerated.  You may have sexual intercourse when it is comfortable.  Refrain from any heavy lifting or straining until approved by your doctor.  a.You may drive when you are no longer taking prescription pain medication, you can comfortably wear a seatbelt, and you can safely maneuver your car and apply brakes. b.RETURN TO WORK:   _____________________________________________  9.You should see your doctor in the office for a follow-up appointment approximately 2-3 weeks after your surgery.  Make sure that you call for this appointment within a day or two after you arrive home to insure a convenient appointment time. 10.OTHER INSTRUCTIONS: ___OK TO SHOWER TOMORROW ICE PACK, TYLENOL AND IBUPROFEN ALSO FOR PAIN NO LIFTING MORE THAN 15 POUNDS FOR 4 WEEKS______________________    _____________________________________  WHEN TO CALL YOUR DOCTOR: 1. Fever over 101.0 2. Inability to urinate 3. Nausea and/or vomiting 4. Extreme swelling or bruising 5. Continued bleeding from incision. 6. Increased pain, redness, or drainage from the incision  The clinic staff is available to answer your questions during regular business hours.  Please dont hesitate to call and ask to speak to one of the nurses for clinical concerns.  If you have a medical emergency, go to the nearest emergency  room or call 911.  A surgeon from [facility] is always on call at the hospital   [address], [address], [place], [place]  [address] ?  [address] [address], [place], [place]   [address] [phone] ? [phone] ? FAX [phone] Web site:  [email or web address]   Post Anesthesia Home Care Instructions  Activity: Get plenty of rest for the remainder of the day. A responsible individual must stay with you for 24 hours following the procedure.  For the next 24 hours, DO NOT: -Drive a car -Advertising copywriter -Drink alcoholic beverages -Take any medication unless instructed by your physician -Make any legal decisions or sign important papers.  Meals: Start with liquid foods such as gelatin or soup. Progress to regular foods as tolerated. Avoid greasy, spicy, heavy foods. If nausea and/or vomiting occur, drink only clear liquids until the nausea and/or vomiting subsides. Call your physician if vomiting continues.  Special Instructions/Symptoms: Your throat may feel dry or sore from the anesthesia or the breathing tube placed in your throat during surgery. If this causes discomfort, gargle with warm salt water. The discomfort should disappear within 24 hours.  If you had a scopolamine patch placed behind your ear for the management of post- operative nausea and/or vomiting:  1. The medication in the patch is effective for 72 hours, after which it should be removed.  Wrap patch in a tissue and discard in the trash. Wash hands thoroughly with soap and water. 2. You may remove the patch earlier than 72 hours if you experience unpleasant side effects which may include dry mouth, dizziness or visual disturbances. 3. Avoid touching the patch. Wash your hands with soap and water after contact with the patch.

## 2016-09-14 NOTE — Anesthesia Procedure Notes (Signed)
Procedure Name: LMA Insertion Date/Time: 09/14/2016 7:36 AM Performed by: Jalisia Puchalski D Pre-anesthesia Checklist: Patient identified, Emergency Drugs available, Suction available and Patient being monitored Patient Re-evaluated:Patient Re-evaluated prior to inductionOxygen Delivery Method: Circle system utilized Preoxygenation: Pre-oxygenation with 100% oxygen Intubation Type: IV induction Ventilation: Mask ventilation without difficulty LMA: LMA inserted LMA Size: 3.0 Number of attempts: 1 Airway Equipment and Method: Bite block Placement Confirmation: positive ETCO2 Tube secured with: Tape Dental Injury: Teeth and Oropharynx as per pre-operative assessment

## 2016-09-14 NOTE — Transfer of Care (Signed)
Immediate Anesthesia Transfer of Care Note  Patient: Felicia Nichols  Procedure(s) Performed: Procedure(s): UMBILICAL HERNIA REPAIR (N/A)  Patient Location: PACU  Anesthesia Type:General  Level of Consciousness: awake and patient cooperative  Airway & Oxygen Therapy: Patient Spontanous Breathing and Patient connected to face mask oxygen  Post-op Assessment: Report given to RN and Post -op Vital signs reviewed and stable  Post vital signs: Reviewed and stable  Last Vitals:  Vitals:   09/14/16 0653  BP: 94/61  Pulse: (!) 42  Resp: 16  Temp: 36.7 C    Last Pain:  Vitals:   09/14/16 0653  TempSrc: Oral  PainSc:          Complications: No apparent anesthesia complications

## 2016-09-14 NOTE — Anesthesia Postprocedure Evaluation (Signed)
Anesthesia Post Note  Patient: Felicia Nichols  Procedure(s) Performed: Procedure(s) (LRB): UMBILICAL HERNIA REPAIR (N/A)  Patient location during evaluation: PACU Anesthesia Type: General Level of consciousness: awake and alert Pain management: pain level controlled Vital Signs Assessment: post-procedure vital signs reviewed and stable Respiratory status: spontaneous breathing, nonlabored ventilation and respiratory function stable Cardiovascular status: blood pressure returned to baseline and stable Postop Assessment: no signs of nausea or vomiting Anesthetic complications: no       Last Vitals:  Vitals:   09/14/16 0822 09/14/16 0855  BP: 106/80 (!) 125/94  Pulse: 75 67  Resp: 11 14  Temp:  (!) 36 C    Last Pain:  Vitals:   09/14/16 0855  TempSrc: Axillary  PainSc: 0-No pain                 Lowella CurbWarren Ray Symon Norwood

## 2016-09-14 NOTE — Interval H&P Note (Signed)
History and Physical Interval Note: no change in H and P  09/14/2016 6:52 AM  Felicia Nichols  has presented today for surgery, with the diagnosis of UMBILICAL HERNIA  The various methods of treatment have been discussed with the patient and family. After consideration of risks, benefits and other options for treatment, the patient has consented to  Procedure(s): UMBILICAL HERNIA REPAIR POSSIBLE MESH (N/A) as a surgical intervention .  The patient's history has been reviewed, patient examined, no change in status, stable for surgery.  I have reviewed the patient's chart and labs.  Questions were answered to the patient's satisfaction.     Dvon Jiles A

## 2016-09-14 NOTE — Op Note (Signed)
UMBILICAL HERNIA REPAIR  Procedure Note  Felicia Nichols 09/14/2016   Pre-op Diagnosis: UMBILICAL HERNIA     Post-op Diagnosis: same  Procedure(s): UMBILICAL HERNIA REPAIR  Surgeon(s): Abigail Miyamotoouglas Janette Harvie, MD  Anesthesia: General  Staff:  Circulator: Randalyn RheaPattie T Caviness, RN Scrub Person: Julio Sicksonald P Ferguson, RN  Estimated Blood Loss: Minimal               Findings: The patient was found to have a very small, less than 1 cm fascial defect at the umbilicus which was repaired primarily  Procedure: The patient was brought to the operating room and identified as the correct patient. She was placed supine on the operating table and general anesthesia was induced. Her abdomen was prepped and draped in the usual sterile fashion. I anesthetized the skin at the lower edge of the umbilicus with Marcaine. I then made a small transverse incision with a scalpel. I took this down to the hernia sac which I separated from the overlying umbilical skin. I excised the sac and reduced the contents back into the abdominal cavity. I then closed the fascial defect with 2 separate figure-of-eight 0 Novafil sutures. I then anesthetized the fascia with Marcaine. Hemostasis appeared to be achieved. I then closed the subcutaneous tissue with interrupted 3-0 Vicryl sutures and closed the skin with a running 4-0 Monocryl suture. Skin glue was then applied. The patient tolerated the procedure well. All the counts were correct at the end of the procedure. The patient was then extubated in the operating room and taken in a stable condition to the recovery room.          Kimon Loewen A   Date: 09/14/2016  Time: 8:06 AM

## 2016-09-14 NOTE — Anesthesia Preprocedure Evaluation (Addendum)
Anesthesia Evaluation  Patient identified by MRN, date of birth, ID band Patient awake    Reviewed: Allergy & Precautions, NPO status , Patient's Chart, lab work & pertinent test results  Airway Mallampati: II  TM Distance: >3 FB Neck ROM: Full    Dental no notable dental hx.    Pulmonary    Pulmonary exam normal breath sounds clear to auscultation       Cardiovascular Normal cardiovascular exam Rhythm:Regular Rate:Normal     Neuro/Psych    GI/Hepatic   Endo/Other    Renal/GU      Musculoskeletal   Abdominal   Peds  Hematology   Anesthesia Other Findings   Reproductive/Obstetrics                            Anesthesia Physical Anesthesia Plan  ASA: I  Anesthesia Plan: General   Post-op Pain Management:    Induction: Intravenous  Airway Management Planned: Oral ETT  Additional Equipment:   Intra-op Plan:   Post-operative Plan: Extubation in OR  Informed Consent: I have reviewed the patients History and Physical, chart, labs and discussed the procedure including the risks, benefits and alternatives for the proposed anesthesia with the patient or authorized representative who has indicated his/her understanding and acceptance.   Dental advisory given  Plan Discussed with: CRNA  Anesthesia Plan Comments:         Anesthesia Quick Evaluation

## 2016-09-15 ENCOUNTER — Encounter (HOSPITAL_BASED_OUTPATIENT_CLINIC_OR_DEPARTMENT_OTHER): Payer: Self-pay | Admitting: Surgery
